# Patient Record
Sex: Female | Born: 2000 | Race: White | Hispanic: No | Marital: Single | Smoking: Never smoker
Health system: Southern US, Community
[De-identification: ages and names within clinical notes are randomized; demographics above are authoritative.]

## PROBLEM LIST (undated history)

## (undated) DIAGNOSIS — D6800 Von Willebrand disease, unspecified: Secondary | ICD-10-CM

## (undated) DIAGNOSIS — D699 Hemorrhagic condition, unspecified: Secondary | ICD-10-CM

---

## 2020-04-05 ENCOUNTER — Encounter (HOSPITAL_COMMUNITY): Payer: Self-pay

## 2020-04-05 ENCOUNTER — Emergency Department (HOSPITAL_COMMUNITY): Payer: Medicaid - Out of State

## 2020-04-05 ENCOUNTER — Other Ambulatory Visit: Payer: Self-pay

## 2020-04-05 ENCOUNTER — Emergency Department (HOSPITAL_COMMUNITY)
Admission: EM | Admit: 2020-04-05 | Discharge: 2020-04-05 | Disposition: A | Discharge status: Home / Self Care | Payer: Medicaid - Out of State | Attending: Emergency Medicine | Admitting: Emergency Medicine

## 2020-04-05 DIAGNOSIS — Z20822 Contact with and (suspected) exposure to covid-19: Secondary | ICD-10-CM | POA: Insufficient documentation

## 2020-04-05 DIAGNOSIS — R110 Nausea: Secondary | ICD-10-CM | POA: Insufficient documentation

## 2020-04-05 DIAGNOSIS — R1031 Right lower quadrant pain: Secondary | ICD-10-CM | POA: Diagnosis present

## 2020-04-05 HISTORY — DX: Hemorrhagic condition, unspecified: D69.9

## 2020-04-05 LAB — CBC WITH DIFFERENTIAL/PLATELET
Abs Immature Granulocytes: 0.02 10*3/uL (ref 0.00–0.07)
Basophils Absolute: 0 10*3/uL (ref 0.0–0.1)
Basophils Relative: 0 %
Eosinophils Absolute: 0.1 10*3/uL (ref 0.0–0.5)
Eosinophils Relative: 1 %
HCT: 39 % (ref 36.0–46.0)
Hemoglobin: 13.3 g/dL (ref 12.0–15.0)
Immature Granulocytes: 0 %
Lymphocytes Relative: 19 %
Lymphs Abs: 2.1 10*3/uL (ref 0.7–4.0)
MCH: 30 pg (ref 26.0–34.0)
MCHC: 34.1 g/dL (ref 30.0–36.0)
MCV: 87.8 fL (ref 80.0–100.0)
Monocytes Absolute: 0.5 10*3/uL (ref 0.1–1.0)
Monocytes Relative: 5 %
Neutro Abs: 8.3 10*3/uL — ABNORMAL HIGH (ref 1.7–7.7)
Neutrophils Relative %: 75 %
Platelets: 350 10*3/uL (ref 150–400)
RBC: 4.44 MIL/uL (ref 3.87–5.11)
RDW: 13.1 % (ref 11.5–15.5)
WBC: 11.1 10*3/uL — ABNORMAL HIGH (ref 4.0–10.5)
nRBC: 0 % (ref 0.0–0.2)

## 2020-04-05 LAB — URINALYSIS, ROUTINE W REFLEX MICROSCOPIC
Bilirubin Urine: NEGATIVE
Glucose, UA: NEGATIVE mg/dL
Hgb urine dipstick: NEGATIVE
Ketones, ur: 5 mg/dL — AB
Leukocytes,Ua: NEGATIVE
Nitrite: NEGATIVE
Protein, ur: NEGATIVE mg/dL
Specific Gravity, Urine: 1.021 (ref 1.005–1.030)
pH: 6 (ref 5.0–8.0)

## 2020-04-05 LAB — BASIC METABOLIC PANEL
Anion gap: 10 (ref 5–15)
BUN: 12 mg/dL (ref 6–20)
CO2: 25 mmol/L (ref 22–32)
Calcium: 9.5 mg/dL (ref 8.9–10.3)
Chloride: 103 mmol/L (ref 98–111)
Creatinine, Ser: 0.61 mg/dL (ref 0.44–1.00)
GFR calc non Af Amer: >60 mL/min (ref >60)
Glucose, Bld: 99 mg/dL (ref 70–99)
Potassium: 3.8 mmol/L (ref 3.5–5.1)
Sodium: 138 mmol/L (ref 135–145)

## 2020-04-05 LAB — I-STAT BETA HCG BLOOD, ED (MC, WL, AP ONLY): I-stat hCG, quantitative: <5 m[IU]/mL (ref <5)

## 2020-04-05 LAB — RESPIRATORY PANEL BY RT PCR (FLU A&B, COVID)
Influenza A by PCR: NEGATIVE
Influenza B by PCR: NEGATIVE
SARS Coronavirus 2 by RT PCR: NEGATIVE

## 2020-04-05 MED ORDER — IOHEXOL 300 MG/ML  SOLN
100.0000 mL | Freq: Once | INTRAMUSCULAR | Status: AC | PRN
  Administered 2020-04-05: 100 mL via INTRAVENOUS

## 2020-04-05 MED ORDER — ONDANSETRON HCL 4 MG/2ML IJ SOLN
4.0000 mg | Freq: Once | INTRAMUSCULAR | Status: AC
  Administered 2020-04-05: 4 mg via INTRAVENOUS
  Filled 2020-04-05: qty 2

## 2020-04-05 MED ORDER — DOCUSATE SODIUM 100 MG PO CAPS: 100.0000 mg | ORAL_CAPSULE | Freq: Two times a day (BID) | ORAL | 0 refills | Status: AC

## 2020-04-05 MED ORDER — MORPHINE SULFATE (PF) 4 MG/ML IV SOLN
4.0000 mg | Freq: Once | INTRAVENOUS | Status: AC
  Administered 2020-04-05: 4 mg via INTRAVENOUS
  Filled 2020-04-05: qty 1

## 2020-04-05 MED ORDER — SODIUM CHLORIDE (PF) 0.9 % IJ SOLN
INTRAMUSCULAR | Status: AC
  Filled 2020-04-05: qty 50

## 2020-04-05 NOTE — ED Provider Notes (Signed)
[facility]-EMERGENCY DEPT Provider Note   CSN: [id number] Arrival date & time: 04/05/20  0019     History No chief complaint on file.   Tammy Oconnor is a 19 y.o. female.  Patient with past medical history notable for prior ectopic pregnancy presents to the emergency department with a chief complaint of right lower quadrant pain. She states that the pain feels similar to previous ectopic pregnancy. She reports associated nausea. States that the pain started suddenly this evening at 11 PM. She rates her pain is severe. Denies any treatments prior to arrival. Denies prior abdominal surgeries. Denies vaginal discharge, vaginal bleeding, dysuria, or hematuria. Denies any other associated symptoms.  The history is provided by the patient. No language interpreter was used.       Past Medical History:  Diagnosis Date  . Bleeding disorder (HCC)     There are no problems to display for this patient.   History reviewed. No pertinent surgical history.   OB History   No obstetric history on file.     History reviewed. No pertinent family history.  Social History   Tobacco Use  . Smoking status: Never Smoker  . Smokeless tobacco: Never Used  Substance Use Topics  . Alcohol use: Never  . Drug use: Never    Home Medications Prior to Admission medications   Not on File    Allergies    Patient has no allergy information on record.  Review of Systems   Review of Systems  All other systems reviewed and are negative.   Physical Exam Updated Vital Signs BP (!) 150/81 (BP Location: Left Arm)   Pulse 99   Temp 98.4 F (36.9 C) (Oral)   Resp 17   Ht 5\' 2"  (1.575 m)   Wt 59 kg   LMP 03/28/2020   SpO2 94%   BMI 23.78 kg/m   Physical Exam Vitals and nursing note reviewed.  Constitutional:      General: She is not in acute distress.    Appearance: She is well-developed.     Comments: Appears uncomfortable  HENT:     Head: Normocephalic and  atraumatic.  Eyes:     Conjunctiva/sclera: Conjunctivae normal.  Cardiovascular:     Rate and Rhythm: Normal rate and regular rhythm.     Heart sounds: No murmur heard.   Pulmonary:     Effort: Pulmonary effort is normal. No respiratory distress.     Breath sounds: Normal breath sounds.  Abdominal:     Palpations: Abdomen is soft.     Tenderness: There is abdominal tenderness.     Comments: Right lower quadrant tenderness  Musculoskeletal:     Cervical back: Neck supple.  Skin:    General: Skin is warm and dry.  Neurological:     Mental Status: She is alert and oriented to person, place, and time.  Psychiatric:        Mood and Affect: Mood normal.        Behavior: Behavior normal.     ED Results / Procedures / Treatments   Labs (all labs ordered are listed, but only abnormal results are displayed) Labs Reviewed  RESPIRATORY PANEL BY RT PCR (FLU A&B, COVID)  BASIC METABOLIC PANEL  CBC WITH DIFFERENTIAL/PLATELET  URINALYSIS, ROUTINE W REFLEX MICROSCOPIC  I-STAT BETA HCG BLOOD, ED ([facility], [facility], [facility] ONLY)    EKG None  Radiology No results found.  Procedures Procedures (including critical care time)  Medications Ordered in ED Medications  morphine 4 MG/ML injection 4 mg (has no administration in time range)  ondansetron (ZOFRAN) injection 4 mg (has no administration in time range)    ED Course  I have reviewed the triage vital signs and the nursing notes.  Pertinent labs & imaging results that were available during my care of the patient were reviewed by me and considered in my medical decision making (see chart for details).    MDM Rules/Calculators/A&P                          This patient complains of RLQ pain, this involves an extensive number of treatment options, and is a complaint that carries with it a high risk of complications and morbidity.    Differential Dx Ectopic, TOA, torsion, appy, KS, UTI  Pertinent Labs I ordered, reviewed, and interpreted  labs, which included CBC, BMP, HCG, UA notable for negative pregnancy test and mild leukocytosis of 11.1, otherwise labs are unremarkable.  Imaging Interpretation I ordered imaging studies which included [place] of pelvis to r/o torsion and CT abd/pel to r/o appy, which showed no acute findings.  Moderate stool burden.  No torsion.  No evidence of appy or KS.Marland Kitchen   Medications I ordered medication morphine for pain.  Reassessments After the interventions stated above, I reevaluated the patient and found improved, but still having some pain  Consultants none  Plan Based on reassuring results and vitals, will trial colace for stool burden.  DC to home with return precautions.      Final Clinical Impression(s) / ED Diagnoses Final diagnoses:  Right lower quadrant abdominal pain    Rx / DC Orders ED Discharge Orders         Ordered    docusate sodium (COLACE) 100 MG capsule  Every 12 hours        04/05/20 0343           Roxy Horseman, PA-C 04/05/20 0346    Horton, Mayer Masker, MD 04/05/20 [phone]

## 2020-04-05 NOTE — ED Triage Notes (Signed)
Pt complains of a sudden onset of lower abd pain, denies vomiting or diarrhea but states she became really nauseated

## 2020-06-11 ENCOUNTER — Ambulatory Visit (INDEPENDENT_AMBULATORY_CARE_PROVIDER_SITE_OTHER): Payer: Medicaid Other

## 2020-06-11 ENCOUNTER — Ambulatory Visit (HOSPITAL_COMMUNITY)
Admission: EM | Admit: 2020-06-11 | Discharge: 2020-06-11 | Disposition: A | Discharge status: Home / Self Care | Payer: Medicaid Other | Attending: Family Medicine | Admitting: Family Medicine

## 2020-06-11 ENCOUNTER — Other Ambulatory Visit: Payer: Self-pay

## 2020-06-11 DIAGNOSIS — R1011 Right upper quadrant pain: Secondary | ICD-10-CM | POA: Insufficient documentation

## 2020-06-11 DIAGNOSIS — R1110 Vomiting, unspecified: Secondary | ICD-10-CM

## 2020-06-11 DIAGNOSIS — R10811 Right upper quadrant abdominal tenderness: Secondary | ICD-10-CM

## 2020-06-11 LAB — COMPREHENSIVE METABOLIC PANEL
ALT: 13 U/L (ref 0–44)
AST: 15 U/L (ref 15–41)
Albumin: 4.4 g/dL (ref 3.5–5.0)
Alkaline Phosphatase: 55 U/L (ref 38–126)
Anion gap: 10 (ref 5–15)
BUN: 9 mg/dL (ref 6–20)
CO2: 23 mmol/L (ref 22–32)
Calcium: 9.1 mg/dL (ref 8.9–10.3)
Chloride: 104 mmol/L (ref 98–111)
Creatinine, Ser: 0.62 mg/dL (ref 0.44–1.00)
GFR, Estimated: >60 mL/min (ref >60)
Glucose, Bld: 93 mg/dL (ref 70–99)
Potassium: 3.7 mmol/L (ref 3.5–5.1)
Sodium: 137 mmol/L (ref 135–145)
Total Bilirubin: 0.8 mg/dL (ref 0.3–1.2)
Total Protein: 7.8 g/dL (ref 6.5–8.1)

## 2020-06-11 LAB — POCT URINALYSIS DIPSTICK, ED / UC
Bilirubin Urine: NEGATIVE
Glucose, UA: NEGATIVE mg/dL
Ketones, ur: 15 mg/dL — AB
Leukocytes,Ua: NEGATIVE
Nitrite: NEGATIVE
Protein, ur: NEGATIVE mg/dL
Specific Gravity, Urine: >=1.03 (ref 1.005–1.030)
Urobilinogen, UA: 0.2 mg/dL (ref 0.0–1.0)
pH: 5.5 (ref 5.0–8.0)

## 2020-06-11 LAB — CBC WITH DIFFERENTIAL/PLATELET
Abs Immature Granulocytes: 0.07 10*3/uL (ref 0.00–0.07)
Basophils Absolute: 0 10*3/uL (ref 0.0–0.1)
Basophils Relative: 0 %
Eosinophils Absolute: 0.1 10*3/uL (ref 0.0–0.5)
Eosinophils Relative: 1 %
HCT: 40.9 % (ref 36.0–46.0)
Hemoglobin: 13.6 g/dL (ref 12.0–15.0)
Immature Granulocytes: 1 %
Lymphocytes Relative: 12 %
Lymphs Abs: 1.8 10*3/uL (ref 0.7–4.0)
MCH: 29.6 pg (ref 26.0–34.0)
MCHC: 33.3 g/dL (ref 30.0–36.0)
MCV: 89.1 fL (ref 80.0–100.0)
Monocytes Absolute: 0.7 10*3/uL (ref 0.1–1.0)
Monocytes Relative: 5 %
Neutro Abs: 12.2 10*3/uL — ABNORMAL HIGH (ref 1.7–7.7)
Neutrophils Relative %: 81 %
Platelets: 313 10*3/uL (ref 150–400)
RBC: 4.59 MIL/uL (ref 3.87–5.11)
RDW: 12.1 % (ref 11.5–15.5)
WBC: 14.9 10*3/uL — ABNORMAL HIGH (ref 4.0–10.5)
nRBC: 0 % (ref 0.0–0.2)

## 2020-06-11 LAB — AMYLASE: Amylase: 67 U/L (ref 28–100)

## 2020-06-11 LAB — LIPASE, BLOOD: Lipase: 32 U/L (ref 11–51)

## 2020-06-11 NOTE — ED Triage Notes (Signed)
Pt present RUQ abdominal pain with vomiting. Pt states symptoms started two weeks ago. Pt states that when she eats the pain becomes unbearable an she cannot hold any food down.

## 2020-06-11 NOTE — Discharge Instructions (Addendum)
X-ray showed moderate amount of stool in the right side of your abdomen.  You can try doing MiraLAX daily for this. Your white blood cell count was mildly elevated.  This may indicate some sort of underlying infection versus some sort of viral illness.  Your other lab work was normal. Parke Simmers diet and drink fluids for now to stay hydrated.  If your symptoms worsen to include worsening abdominal pain, nausea vomiting or diarrhea you will need to go to the ER.

## 2020-06-11 NOTE — ED Provider Notes (Signed)
[facility]    CSN: [id number] Arrival date & time: 06/11/20  1406      History   Chief Complaint Chief Complaint  Patient presents with  . Abdominal Pain    RUQ    HPI Tammy Oconnor is a 19 y.o. female.   Patient is a 19 year old female presents today with approximately 2 weeks of right upper quadrant abdominal pain.  This has been pretty constant.  Reporting vomiting after eating meals.  Denies any specific diet of spicy, greasy or fried.  She has not had any diarrhea or constipation.  No fevers.  The pain waxes and wanes.  No blood in stool.  Denies any alcohol or drug use     Past Medical History:  Diagnosis Date  . Bleeding disorder (HCC)     There are no problems to display for this patient.   No past surgical history on file.  OB History   No obstetric history on file.      Home Medications    Prior to Admission medications   Medication Sig Start Date End Date Taking? Authorizing Provider  docusate sodium (COLACE) 100 MG capsule Take 1 capsule (100 mg total) by mouth every 12 (twelve) hours. 04/05/20   Roxy Horseman, PA-C    Family History No family history on file.  Social History Social History   Tobacco Use  . Smoking status: Never Smoker  . Smokeless tobacco: Never Used  Substance Use Topics  . Alcohol use: Never  . Drug use: Never     Allergies   Patient has no known allergies.   Review of Systems Review of Systems   Physical Exam Triage Vital Signs ED Triage Vitals  Enc Vitals Group     BP 06/11/20 1443 (!) 131/55     Pulse Rate 06/11/20 1443 86     Resp 06/11/20 1443 16     Temp 06/11/20 1443 97.8 F (36.6 C)     Temp Source 06/11/20 1443 Oral     SpO2 06/11/20 1443 100 %     Weight --      Height --      Head Circumference --      Peak Flow --      Pain Score 06/11/20 1441 4     Pain Loc --      Pain Edu? --      Excl. in GC? --    No data found.  Updated Vital Signs BP (!) 131/55 (BP Location:  Right Arm)   Pulse 86   Temp 97.8 F (36.6 C) (Oral)   Resp 16   SpO2 100%   Visual Acuity Right Eye Distance:   Left Eye Distance:   Bilateral Distance:    Right Eye Near:   Left Eye Near:    Bilateral Near:     Physical Exam Vitals and nursing note reviewed.  Constitutional:      General: She is not in acute distress.    Appearance: Normal appearance. She is not ill-appearing, toxic-appearing or diaphoretic.  HENT:     Head: Normocephalic.     Nose: Nose normal.     Mouth/Throat:     Pharynx: Oropharynx is clear.  Eyes:     Conjunctiva/sclera: Conjunctivae normal.  Pulmonary:     Effort: Pulmonary effort is normal.  Abdominal:     General: Abdomen is flat. Bowel sounds are increased.     Palpations: Abdomen is soft. There is no hepatomegaly or splenomegaly.  Tenderness: There is abdominal tenderness in the right upper quadrant. There is no right CVA tenderness, left CVA tenderness, guarding or rebound. Negative signs include Murphy's sign.  Musculoskeletal:        General: Normal range of motion.     Cervical back: Normal range of motion.  Skin:    General: Skin is warm and dry.     Findings: No rash.  Neurological:     Mental Status: She is alert.  Psychiatric:        Mood and Affect: Mood normal.      UC Treatments / Results  Labs (all labs ordered are listed, but only abnormal results are displayed) Labs Reviewed  CBC WITH DIFFERENTIAL/PLATELET - Abnormal; Notable for the following components:      Result Value   WBC 14.9 (*)    Neutro Abs 12.2 (*)    All other components within normal limits  POCT URINALYSIS DIPSTICK, ED / UC - Abnormal; Notable for the following components:   Ketones, ur 15 (*)    Hgb urine dipstick TRACE (*)    All other components within normal limits  COMPREHENSIVE METABOLIC PANEL  LIPASE, BLOOD  AMYLASE    EKG   Radiology DG Abd 1 View  Result Date: 06/11/2020 CLINICAL DATA:  Abdominal pain right upper quadrant.  Vomiting after eating for 2 weeks. EXAM: ABDOMEN - 1 VIEW COMPARISON:  Abdominal CT 04/05/2020 FINDINGS: No bowel dilatation to suggest obstruction. No evidence of free air in the supine views. Moderate stool in the right colon, small volume of stool distally. No abnormal rectal distention. No radiopaque calculi or abnormal soft tissue calcifications. No osseous abnormalities are seen. IMPRESSION: Negative abdominal radiograph. Electronically Signed   By: Narda Rutherford M.D.   On: 06/11/2020 15:55    Procedures Procedures (including critical care time)  Medications Ordered in UC Medications - No data to display  Initial Impression / Assessment and Plan / UC Course  I have reviewed the triage vital signs and the nursing notes.  Pertinent labs & imaging results that were available during my care of the patient were reviewed by me and considered in my medical decision making (see chart for details).     Right upper quadrant abdominal pain Differentials include cholecystitis versus constipation versus viral etiology X-ray with moderate amount of stool specifically in the right colon.  Recommended MiraLAX for this. Blood work mostly unremarkable besides mildly elevated white blood cell count 14.9 Recommended bland diet for now and stay hydrated with fluids. Monitor, rest and for any worsening symptoms she will need to go to the ER for further management Pt understanding and agreed.  Final Clinical Impressions(s) / UC Diagnoses   Final diagnoses:  RUQ pain     Discharge Instructions     X-ray showed moderate amount of stool in the right side of your abdomen.  You can try doing MiraLAX daily for this. Your white blood cell count was mildly elevated.  This may indicate some sort of underlying infection versus some sort of viral illness.  Your other lab work was normal. Parke Simmers diet and drink fluids for now to stay hydrated.  If your symptoms worsen to include worsening abdominal pain,  nausea vomiting or diarrhea you will need to go to the ER.    ED Prescriptions    None     PDMP not reviewed this encounter.   Janace Aris, NP 06/11/20 1715

## 2020-06-16 ENCOUNTER — Emergency Department (HOSPITAL_COMMUNITY)
Admission: EM | Admit: 2020-06-16 | Discharge: 2020-06-17 | Disposition: A | Discharge status: Home / Self Care | Payer: Medicaid Other | Attending: Emergency Medicine | Admitting: Emergency Medicine

## 2020-06-16 ENCOUNTER — Emergency Department (HOSPITAL_COMMUNITY): Payer: Medicaid Other

## 2020-06-16 ENCOUNTER — Other Ambulatory Visit: Payer: Self-pay

## 2020-06-16 ENCOUNTER — Encounter (HOSPITAL_COMMUNITY): Payer: Self-pay

## 2020-06-16 DIAGNOSIS — R1011 Right upper quadrant pain: Secondary | ICD-10-CM | POA: Diagnosis not present

## 2020-06-16 LAB — COMPREHENSIVE METABOLIC PANEL
ALT: 11 U/L (ref 0–44)
AST: 14 U/L — ABNORMAL LOW (ref 15–41)
Albumin: 4.5 g/dL (ref 3.5–5.0)
Alkaline Phosphatase: 52 U/L (ref 38–126)
Anion gap: 11 (ref 5–15)
BUN: 11 mg/dL (ref 6–20)
CO2: 24 mmol/L (ref 22–32)
Calcium: 9 mg/dL (ref 8.9–10.3)
Chloride: 102 mmol/L (ref 98–111)
Creatinine, Ser: 0.58 mg/dL (ref 0.44–1.00)
GFR, Estimated: >60 mL/min (ref >60)
Glucose, Bld: 85 mg/dL (ref 70–99)
Potassium: 3.5 mmol/L (ref 3.5–5.1)
Sodium: 137 mmol/L (ref 135–145)
Total Bilirubin: 0.7 mg/dL (ref 0.3–1.2)
Total Protein: 7.6 g/dL (ref 6.5–8.1)

## 2020-06-16 LAB — URINALYSIS, ROUTINE W REFLEX MICROSCOPIC
Bacteria, UA: NONE SEEN
Bilirubin Urine: NEGATIVE
Glucose, UA: NEGATIVE mg/dL
Ketones, ur: 80 mg/dL — AB
Leukocytes,Ua: NEGATIVE
Nitrite: NEGATIVE
Protein, ur: NEGATIVE mg/dL
RBC / HPF: >50 RBC/hpf — ABNORMAL HIGH (ref 0–5)
Specific Gravity, Urine: 1.027 (ref 1.005–1.030)
pH: 5 (ref 5.0–8.0)

## 2020-06-16 LAB — CBC
HCT: 37.2 % (ref 36.0–46.0)
Hemoglobin: 12.5 g/dL (ref 12.0–15.0)
MCH: 29.9 pg (ref 26.0–34.0)
MCHC: 33.6 g/dL (ref 30.0–36.0)
MCV: 89 fL (ref 80.0–100.0)
Platelets: 316 10*3/uL (ref 150–400)
RBC: 4.18 MIL/uL (ref 3.87–5.11)
RDW: 12.1 % (ref 11.5–15.5)
WBC: 9.1 10*3/uL (ref 4.0–10.5)
nRBC: 0 % (ref 0.0–0.2)

## 2020-06-16 LAB — I-STAT BETA HCG BLOOD, ED (MC, WL, AP ONLY): I-stat hCG, quantitative: <5 m[IU]/mL (ref <5)

## 2020-06-16 LAB — LIPASE, BLOOD: Lipase: 29 U/L (ref 11–51)

## 2020-06-16 MED ORDER — SODIUM CHLORIDE 0.9 % IV BOLUS
1000.0000 mL | Freq: Once | INTRAVENOUS | Status: AC
  Administered 2020-06-16: 1000 mL via INTRAVENOUS

## 2020-06-16 MED ORDER — MORPHINE SULFATE (PF) 4 MG/ML IV SOLN
4.0000 mg | Freq: Once | INTRAVENOUS | Status: AC
  Administered 2020-06-16: 4 mg via INTRAVENOUS
  Filled 2020-06-16: qty 1

## 2020-06-16 MED ORDER — ONDANSETRON 4 MG PO TBDP
4.0000 mg | ORAL_TABLET | Freq: Once | ORAL | Status: AC
  Administered 2020-06-16: 4 mg via ORAL
  Filled 2020-06-16: qty 1

## 2020-06-16 MED ORDER — ONDANSETRON HCL 4 MG/2ML IJ SOLN
4.0000 mg | Freq: Once | INTRAMUSCULAR | Status: AC
  Administered 2020-06-16: 4 mg via INTRAVENOUS
  Filled 2020-06-16: qty 2

## 2020-06-16 NOTE — ED Notes (Signed)
Pt c/o right upper quadrant pain. Pt states every time see eats she vomits

## 2020-06-16 NOTE — ED Provider Notes (Signed)
[facility]-EMERGENCY DEPT Provider Note   CSN: [id number] Arrival date & time: 06/16/20  1846     History No chief complaint on file.   Tammy Oconnor is a 19 y.o. female.  Patient presents to the ED with a chief complaint of RUQ abdominal pain.  She states that she has been having the symptoms for the past 3 weeks, but they are becoming increasingly frequent.  She associated worsened pain with eating food.  She has had associated nausea and vomiting.  Denies any changes in BM.  Denies any dysuria.  Denies any successful treatment PTA.  The history is provided by the patient. No language interpreter was used.       Past Medical History:  Diagnosis Date  . Bleeding disorder (HCC)     There are no problems to display for this patient.   History reviewed. No pertinent surgical history.   OB History   No obstetric history on file.     Family History  Problem Relation Age of Onset  . Migraines Mother   . Depression Mother   . Migraines Father     Social History   Tobacco Use  . Smoking status: Never Smoker  . Smokeless tobacco: Never Used  Vaping Use  . Vaping Use: Never used  Substance Use Topics  . Alcohol use: Never  . Drug use: Never    Home Medications Prior to Admission medications   Medication Sig Start Date End Date Taking? Authorizing Provider  docusate sodium (COLACE) 100 MG capsule Take 1 capsule (100 mg total) by mouth every 12 (twelve) hours. 04/05/20   Roxy Horseman, PA-C    Allergies    Dhea [nutritional supplements]  Review of Systems   Review of Systems  All other systems reviewed and are negative.   Physical Exam Updated Vital Signs BP 129/78 (BP Location: Left Arm)   Pulse 80   Temp 98.3 F (36.8 C) (Oral)   Resp 16   Ht 5\' 2"  (1.575 m)   Wt 63.5 kg   LMP 06/16/2020   SpO2 100%   BMI 25.61 kg/m   Physical Exam Vitals and nursing note reviewed.  Constitutional:      General: She is not in acute  distress.    Appearance: She is well-developed and well-nourished.  HENT:     Head: Normocephalic and atraumatic.  Eyes:     Conjunctiva/sclera: Conjunctivae normal.  Cardiovascular:     Rate and Rhythm: Normal rate and regular rhythm.     Heart sounds: No murmur heard.   Pulmonary:     Effort: Pulmonary effort is normal. No respiratory distress.     Breath sounds: Normal breath sounds.  Abdominal:     Palpations: Abdomen is soft.     Tenderness: There is abdominal tenderness.     Comments: RUQ tenderness  Musculoskeletal:        General: No edema. Normal range of motion.     Cervical back: Neck supple.  Skin:    General: Skin is warm and dry.  Neurological:     Mental Status: She is alert and oriented to person, place, and time.  Psychiatric:        Mood and Affect: Mood and affect and mood normal.        Behavior: Behavior normal.     ED Results / Procedures / Treatments   Labs (all labs ordered are listed, but only abnormal results are displayed) Labs Reviewed  COMPREHENSIVE METABOLIC PANEL -  Abnormal; Notable for the following components:      Result Value   AST 14 (*)    All other components within normal limits  URINALYSIS, ROUTINE W REFLEX MICROSCOPIC - Abnormal; Notable for the following components:   Hgb urine dipstick SMALL (*)    Ketones, ur 80 (*)    RBC / HPF >50 (*)    All other components within normal limits  LIPASE, BLOOD  CBC  I-STAT BETA HCG BLOOD, ED ([facility], [facility], [facility] ONLY)    EKG None  Radiology No results found.  Procedures Procedures (including critical care time)  Medications Ordered in ED Medications  morphine 4 MG/ML injection 4 mg (has no administration in time range)  ondansetron (ZOFRAN) injection 4 mg (has no administration in time range)  sodium chloride 0.9 % bolus 1,000 mL (has no administration in time range)  ondansetron (ZOFRAN-ODT) disintegrating tablet 4 mg (4 mg Oral Given 06/16/20 2044)    ED Course  I have reviewed  the triage vital signs and the nursing notes.  Pertinent labs & imaging results that were available during my care of the patient were reviewed by me and considered in my medical decision making (see chart for details).    MDM Rules/Calculators/A&P                          This patient complains of right upper quadrant abdominal pain, this involves an extensive number of treatment options, and is a complaint that carries with it a high risk of complications and morbidity.    Differential Dx Cholecystitis, cholelithiasis, kidney stone, pneumonia  Pertinent Labs I ordered, reviewed, and interpreted labs, show normal CBC, CMP, lipase.  UA has some blood, but I think KS is less likely given presentation.  Imaging Interpretation I ordered imaging studies which included RUQ [place], which showed no stone or wall thickening, normal CBD.   Medications I ordered medication morphine for pain.  Reassessments After the interventions stated above, I reevaluated the patient and found feeling improved.  Stable for discharge.  Plan DC to home with PCP follow-up.  Will trial omeprazole and carafate.  Seen by and discussed with Dr. Eudelia Bunch, who agrees with plan.     Final Clinical Impression(s) / ED Diagnoses Final diagnoses:  RUQ abdominal pain    Rx / DC Orders ED Discharge Orders         Ordered    omeprazole (PRILOSEC) 20 MG capsule  Daily        06/17/20 0058    sucralfate (CARAFATE) 1 g tablet  3 times daily with meals & bedtime        06/17/20 0058           Roxy Horseman, PA-C 06/17/20 0104    Nira Conn, MD 06/19/20 1136

## 2020-06-16 NOTE — ED Triage Notes (Signed)
Patient c/o RUQ pain and vomiting x 2 weeks.f Patient states the pain worsens after eating.

## 2020-06-17 MED ORDER — SUCRALFATE 1 G PO TABS: 1.0000 g | ORAL_TABLET | Freq: Three times a day (TID) | ORAL | 0 refills | Status: AC

## 2020-06-17 MED ORDER — OMEPRAZOLE 20 MG PO CPDR: 20.0000 mg | DELAYED_RELEASE_CAPSULE | Freq: Every day | ORAL | 0 refills | Status: AC

## 2020-06-17 NOTE — Discharge Instructions (Addendum)
No clear cause of your symptoms was found tonight.  Your ultrasound was reassuring.  I am going to trial  you on medication that can help with stomach ulcer.  Take the medication for 2 weeks.  If improving, continue it for another 2 weeks.  If not improving, see your doctor.  If you get worse, return to the ER.

## 2020-06-23 ENCOUNTER — Emergency Department (HOSPITAL_COMMUNITY): Payer: Medicaid Other

## 2020-06-23 ENCOUNTER — Emergency Department (HOSPITAL_COMMUNITY)
Admission: EM | Admit: 2020-06-23 | Discharge: 2020-06-23 | Disposition: A | Discharge status: Home / Self Care | Payer: Medicaid Other | Attending: Emergency Medicine | Admitting: Emergency Medicine

## 2020-06-23 ENCOUNTER — Encounter (HOSPITAL_COMMUNITY): Payer: Self-pay | Admitting: Obstetrics and Gynecology

## 2020-06-23 ENCOUNTER — Other Ambulatory Visit: Payer: Self-pay

## 2020-06-23 DIAGNOSIS — R1013 Epigastric pain: Secondary | ICD-10-CM | POA: Insufficient documentation

## 2020-06-23 DIAGNOSIS — R112 Nausea with vomiting, unspecified: Secondary | ICD-10-CM | POA: Insufficient documentation

## 2020-06-23 DIAGNOSIS — R1011 Right upper quadrant pain: Secondary | ICD-10-CM | POA: Insufficient documentation

## 2020-06-23 LAB — URINALYSIS, ROUTINE W REFLEX MICROSCOPIC
Bilirubin Urine: NEGATIVE
Glucose, UA: NEGATIVE mg/dL
Hgb urine dipstick: NEGATIVE
Ketones, ur: NEGATIVE mg/dL
Leukocytes,Ua: NEGATIVE
Nitrite: NEGATIVE
Protein, ur: NEGATIVE mg/dL
Specific Gravity, Urine: 1.021 (ref 1.005–1.030)
pH: 7 (ref 5.0–8.0)

## 2020-06-23 LAB — COMPREHENSIVE METABOLIC PANEL
ALT: 12 U/L (ref 0–44)
AST: 14 U/L — ABNORMAL LOW (ref 15–41)
Albumin: 4.6 g/dL (ref 3.5–5.0)
Alkaline Phosphatase: 55 U/L (ref 38–126)
Anion gap: 9 (ref 5–15)
BUN: 10 mg/dL (ref 6–20)
CO2: 26 mmol/L (ref 22–32)
Calcium: 9.2 mg/dL (ref 8.9–10.3)
Chloride: 104 mmol/L (ref 98–111)
Creatinine, Ser: 0.66 mg/dL (ref 0.44–1.00)
GFR, Estimated: >60 mL/min (ref >60)
Glucose, Bld: 97 mg/dL (ref 70–99)
Potassium: 4 mmol/L (ref 3.5–5.1)
Sodium: 139 mmol/L (ref 135–145)
Total Bilirubin: 0.7 mg/dL (ref 0.3–1.2)
Total Protein: 7.8 g/dL (ref 6.5–8.1)

## 2020-06-23 LAB — CBC
HCT: 40.4 % (ref 36.0–46.0)
Hemoglobin: 13.3 g/dL (ref 12.0–15.0)
MCH: 29.8 pg (ref 26.0–34.0)
MCHC: 32.9 g/dL (ref 30.0–36.0)
MCV: 90.4 fL (ref 80.0–100.0)
Platelets: 309 10*3/uL (ref 150–400)
RBC: 4.47 MIL/uL (ref 3.87–5.11)
RDW: 12.2 % (ref 11.5–15.5)
WBC: 6.3 10*3/uL (ref 4.0–10.5)
nRBC: 0 % (ref 0.0–0.2)

## 2020-06-23 LAB — RAPID URINE DRUG SCREEN, HOSP PERFORMED
Amphetamines: NOT DETECTED
Barbiturates: NOT DETECTED
Benzodiazepines: NOT DETECTED
Cocaine: NOT DETECTED
Opiates: NOT DETECTED
Tetrahydrocannabinol: POSITIVE — AB

## 2020-06-23 LAB — I-STAT BETA HCG BLOOD, ED (MC, WL, AP ONLY): I-stat hCG, quantitative: <5 m[IU]/mL (ref <5)

## 2020-06-23 LAB — LIPASE, BLOOD: Lipase: 38 U/L (ref 11–51)

## 2020-06-23 MED ORDER — KETOROLAC TROMETHAMINE 30 MG/ML IJ SOLN
30.0000 mg | Freq: Once | INTRAMUSCULAR | Status: AC
  Administered 2020-06-23: 30 mg via INTRAVENOUS
  Filled 2020-06-23: qty 1

## 2020-06-23 MED ORDER — IOHEXOL 300 MG/ML  SOLN
100.0000 mL | Freq: Once | INTRAMUSCULAR | Status: AC | PRN
  Administered 2020-06-23: 100 mL via INTRAVENOUS

## 2020-06-23 MED ORDER — SODIUM CHLORIDE 0.9 % IV BOLUS
1000.0000 mL | Freq: Once | INTRAVENOUS | Status: AC
Start: 2020-06-23 — End: 2020-06-23
  Administered 2020-06-23: 1000 mL via INTRAVENOUS

## 2020-06-23 MED ORDER — HYDROCODONE-ACETAMINOPHEN 5-325 MG PO TABS: 1.0000 | ORAL_TABLET | ORAL | 0 refills | Status: AC | PRN

## 2020-06-23 MED ORDER — ONDANSETRON HCL 4 MG/2ML IJ SOLN
4.0000 mg | Freq: Once | INTRAMUSCULAR | Status: AC
Start: 2020-06-23 — End: 2020-06-23
  Administered 2020-06-23: 4 mg via INTRAVENOUS
  Filled 2020-06-23: qty 2

## 2020-06-23 MED ORDER — ONDANSETRON 4 MG PO TBDP: 4.0000 mg | ORAL_TABLET | Freq: Three times a day (TID) | ORAL | 0 refills | Status: DC | PRN

## 2020-06-23 MED ORDER — HYDROMORPHONE HCL 1 MG/ML IJ SOLN
1.0000 mg | Freq: Once | INTRAMUSCULAR | Status: AC
  Administered 2020-06-23: 1 mg via INTRAVENOUS
  Filled 2020-06-23: qty 1

## 2020-06-23 MED ORDER — MORPHINE SULFATE (PF) 4 MG/ML IV SOLN
4.0000 mg | Freq: Once | INTRAVENOUS | Status: AC
Start: 2020-06-23 — End: 2020-06-23
  Administered 2020-06-23: 4 mg via INTRAVENOUS
  Filled 2020-06-23: qty 1

## 2020-06-23 NOTE — ED Provider Notes (Signed)
[facility]-EMERGENCY DEPT Provider Note   CSN: [id number] Arrival date & time: 06/23/20  1129     History Chief Complaint  Patient presents with  . Abdominal Pain  . Emesis    Tammy Oconnor is Oconnor 19 y.o. female with past medical history who presents for evaluation of abdominal pain.  Patient states she has had abdominal pain over the last few months however worse over the last few weeks.  Located to her epigastric and right upper quadrant.  Pain is constant.  No associated diarrhea.  Has had multiple episodes of emesis.  Has been seen here in the emergency department couple times for this with negative CT scan and ultrasound.  She has been taking Carafate and Pepcid without relief.  Pain not slightly worse with food intake.  Has not attempted any antiemetics at home.  No fever, chills, chest pain, shortness of breath, dysuria, diarrhea, constipation, pelvic pain, vaginal discharge.  Was seen by PCP told if her pain worsened to come to the emergency department for evaluation.  Her PCP, Tammy Oconnor stated they had placed an emergent GI referral.  She has not been called to schedule appointment with them.  Rates her current pain Oconnor 9/10. Denies marijuana use.  History obtained from patient, family member past medical records.  No interpreter used.   [facility]  HPI     Past Medical History:  Diagnosis Date  . Bleeding disorder (HCC)     There are no problems to display for this patient.   History reviewed. No pertinent surgical history.   OB History   No obstetric history on file.     Family History  Problem Relation Age of Onset  . Migraines Mother   . Depression Mother   . Migraines Father     Social History   Tobacco Use  . Smoking status: Never Smoker  . Smokeless tobacco: Never Used  Vaping Use  . Vaping Use: Never used  Substance Use Topics  . Alcohol use: Never  . Drug use: Never    Home Medications Prior to Admission medications    Medication Sig Start Date End Date Taking? Authorizing Provider  acetaminophen (TYLENOL) 500 MG tablet Take 500 mg by mouth every 6 (six) hours as needed for moderate pain.   Yes [provider]  omeprazole (PRILOSEC) 20 MG capsule Take 1 capsule (20 mg total) by mouth daily. 06/17/20  Yes Tammy Horseman, PA-C  ondansetron (ZOFRAN) 8 MG tablet Take 8 mg by mouth every 8 (eight) hours as needed for nausea or vomiting. 06/22/20  Yes [provider]  docusate sodium (COLACE) 100 MG capsule Take 1 capsule (100 mg total) by mouth every 12 (twelve) hours. Patient not taking: Reported on 06/23/2020 04/05/20   Tammy Horseman, PA-C  HYDROcodone-acetaminophen (NORCO/VICODIN) 5-325 MG tablet Take 1 tablet by mouth every 4 (four) hours as needed. 06/23/20   Tammy Sterbenz A, PA-C  ondansetron (ZOFRAN ODT) 4 MG disintegrating tablet Take 1 tablet (4 mg total) by mouth every 8 (eight) hours as needed for nausea or vomiting. 06/23/20   Tammy Fojtik A, PA-C  sucralfate (CARAFATE) 1 g tablet Take 1 tablet (1 g total) by mouth 4 (four) times daily -  with meals and at bedtime. Patient not taking: Reported on 06/23/2020 06/17/20   Tammy Horseman, PA-C    Allergies    Dhea [nutritional supplements]  Review of Systems   Review of Systems  Constitutional: Negative.   HENT: Negative.   Respiratory:  Negative.   Cardiovascular: Negative.   Gastrointestinal: Positive for abdominal pain, nausea and vomiting. Negative for abdominal distention, anal bleeding, blood in stool, constipation, diarrhea and rectal pain.  Genitourinary: Negative.   Musculoskeletal: Negative.   Skin: Negative.   Neurological: Negative.   All other systems reviewed and are negative.   Physical Exam Updated Vital Signs BP (!) 110/59   Pulse 77   Temp 98.5 F (36.9 C) (Oral)   Resp 18   LMP 06/16/2020   SpO2 96%   Physical Exam Vitals and nursing note reviewed.  Constitutional:      General: She is  not in acute distress.    Appearance: She is well-developed and well-nourished. She is not ill-appearing, toxic-appearing or diaphoretic.  HENT:     Head: Normocephalic and atraumatic.     Mouth/Throat:     Mouth: Mucous membranes are moist.  Eyes:     Pupils: Pupils are equal, round, and reactive to light.  Cardiovascular:     Rate and Rhythm: Normal rate.     Pulses: Intact distal pulses.     Heart sounds: Normal heart sounds.  Pulmonary:     Effort: Pulmonary effort is normal. No respiratory distress.     Breath sounds: Normal breath sounds.  Abdominal:     General: Bowel sounds are normal. There is no distension.     Palpations: Abdomen is soft.     Tenderness: There is abdominal tenderness in the right upper quadrant and epigastric area. There is no right CVA tenderness, left CVA tenderness, guarding or rebound. Negative signs include Murphy's sign and McBurney's sign.     Hernia: No hernia is present.  Musculoskeletal:        General: Normal range of motion.     Cervical back: Normal range of motion.  Skin:    General: Skin is warm and dry.  Neurological:     Mental Status: She is alert.  Psychiatric:        Mood and Affect: Mood and affect normal.     ED Results / Procedures / Treatments   Labs (all labs ordered are listed, but only abnormal results are displayed) Labs Reviewed  COMPREHENSIVE METABOLIC PANEL - Abnormal; Notable for the following components:      Result Value   AST 14 (*)    All other components within normal limits  RAPID URINE DRUG SCREEN, HOSP PERFORMED - Abnormal; Notable for the following components:   Tetrahydrocannabinol POSITIVE (*)    All other components within normal limits  LIPASE, BLOOD  CBC  URINALYSIS, ROUTINE W REFLEX MICROSCOPIC  I-STAT BETA HCG BLOOD, ED ([facility], [facility], [facility] ONLY)   EKG None  Radiology CT ABDOMEN PELVIS W CONTRAST  Result Date: 06/23/2020 CLINICAL DATA:  Postprandial vomiting, abdominal pain EXAM: CT ABDOMEN AND  PELVIS WITH CONTRAST TECHNIQUE: Multidetector CT imaging of the abdomen and pelvis was performed using the standard protocol following bolus administration of intravenous contrast. CONTRAST:  OMNIPAQUE IOHEXOL 300 MG/ML  SOLN COMPARISON:  06/16/2020, 04/05/2020 FINDINGS: Lower chest: No acute pleural or parenchymal lung disease. Hepatobiliary: No focal liver abnormality is seen. No gallstones, gallbladder wall thickening, or biliary dilatation. Pancreas: Unremarkable. No pancreatic ductal dilatation or surrounding inflammatory changes. Spleen: Normal in size without focal abnormality. Adrenals/Urinary Tract: Adrenal glands are unremarkable. Kidneys are normal, without renal calculi, focal lesion, or hydronephrosis. Bladder is unremarkable. Stomach/Bowel: No bowel obstruction or ileus. Normal appendix right lower quadrant. No bowel wall thickening or inflammatory change. Vascular/Lymphatic: No  significant vascular findings are present. No enlarged abdominal or pelvic lymph nodes. Reproductive: Uterus and bilateral adnexa are unremarkable. Other: No free fluid or free gas.  No abdominal wall hernia. Musculoskeletal: No acute bony abnormality. Reconstructed images demonstrate no additional findings. IMPRESSION: 1. Stable exam, no acute intra-abdominal or intrapelvic process. Electronically Signed   By: Sharlet Salina M.D.   On: 06/23/2020 15:55    Procedures Procedures (including critical care time)  Medications Ordered in ED Medications  sodium chloride 0.9 % bolus 1,000 mL (0 mLs Intravenous Stopped 06/23/20 1707)  ondansetron (ZOFRAN) injection 4 mg (4 mg Intravenous Given 06/23/20 1509)  morphine 4 MG/ML injection 4 mg (4 mg Intravenous Given 06/23/20 1511)  iohexol (OMNIPAQUE) 300 MG/ML solution 100 mL (100 mLs Intravenous Contrast Given 06/23/20 1542)  ketorolac (TORADOL) 30 MG/ML injection 30 mg (30 mg Intravenous Given 06/23/20 1706)  HYDROmorphone (DILAUDID) injection 1 mg (1 mg Intravenous  Given 06/23/20 1706)    ED Course  I have reviewed the triage vital signs and the nursing notes.  Pertinent labs & imaging results that were available during my care of the patient were reviewed by me and considered in my medical decision making (see chart for details).  19 year old presents for evaluation of abdominal pain.  She is afebrile, nonseptic, non-ill-appearing.  Began Oconnor few months ago however worsening over the last few weeks.  Has been evaluated in the emergency department times in the past.  Recently last week with negative right upper quadrant ultrasound.  Seen again by PCP today.  Had urgent GI referral placed.  Patient without any melena, bright blood per rectum.  No diarrhea.  Denies any marijuana use.  No urinary complaints.  No chest pain or shortness of breath.  Does have diffuse tenderness to her upper abdomen however negative Murphy sign, negative McBurney point.  No chronic NSAID use, EtOH use history of PUD.  No upper respiratory complaints to suggest infectious process, PE, dissection as cause of her right upper quadrant abdominal pain.  Plan on labs, imaging and reassess.  Labs and imaging personally reviewed and interpreted:  CBC without leukocytosis Metabolic panel without electrolyte, renal normality Pregnancy test negative Lipase 38 UA negative for infection UDS positive for THC.  On reevaluation patient states pain has mildly improved however has not resolved.  Took additional pain medication.  I did discuss with patient in room her positive THC on her UDS.  States she uses delta 8.  Uses this weekly.  Will give additional medication.  CT scan is reassuring without any acute intra-abdominal process.  Had ultrasound negative last week without any stones.  Question if patient needs outpatient HIDA scan given recurrent pain.  Reevaluation pain control.  She is tolerating p.o. intake.  Will place referral to GI.  Discussed symptomatic management at home.  She is  agreeable to this.  Patient is nontoxic, nonseptic appearing, in no apparent distress.  Patient's pain and other symptoms adequately managed in emergency department.  Fluid bolus given.  Labs, imaging and vitals reviewed.  Patient does not meet the SIRS or Sepsis criteria.  On repeat exam patient does not have Oconnor surgical abdomin and there are no peritoneal signs.  No indication of appendicitis, bowel obstruction, bowel perforation, cholecystitis, diverticulitis, PID or ectopic pregnancy.    The patient has been appropriately medically screened and/or stabilized in the ED. I have low suspicion for any other emergent medical condition which would require further screening, evaluation or treatment in the ED or require  inpatient management.  Patient is hemodynamically stable and in no acute distress.  Patient able to ambulate in department prior to ED.  Evaluation does not show acute pathology that would require ongoing or additional emergent interventions while in the emergency department or further inpatient treatment.  I have discussed the diagnosis with the patient and answered all questions.  Pain is been managed while in the emergency department and patient has no further complaints prior to discharge.  Patient is comfortable with plan discussed in room and is stable for discharge at this time.  I have discussed strict return precautions for returning to the emergency department.  Patient was encouraged to follow-up with PCP/specialist refer to at discharge.     MDM Rules/Calculators/Oconnor&P                           Final Clinical Impression(s) / ED Diagnoses Final diagnoses:  Right upper quadrant abdominal pain    Rx / DC Orders ED Discharge Orders         Ordered    HYDROcodone-acetaminophen (NORCO/VICODIN) 5-325 MG tablet  Every 4 hours PRN        06/23/20 1810    ondansetron (ZOFRAN ODT) 4 MG disintegrating tablet  Every 8 hours PRN        06/23/20 1811           Odie Edmonds A,  PA-C 06/23/20 1811    Charlynne PanderYao, David Hsienta, MD 06/23/20 2223

## 2020-06-23 NOTE — ED Triage Notes (Signed)
Patient reports she was sent by her PCP to have a CT scan done. Patient reports she was seen here about a week ago and had [place] and X-ray but is still having frequent back pressure, abdominal pain, and emesis.

## 2020-06-23 NOTE — Discharge Instructions (Addendum)
Call to schedule appointment with [facility].  Their contact information is listed in your discharge paperwork.  Return for new worsening symptoms.  I have written you for a short course of medication to help with your pain.

## 2020-07-19 ENCOUNTER — Other Ambulatory Visit: Payer: Self-pay | Admitting: Physician Assistant

## 2020-07-19 DIAGNOSIS — R1011 Right upper quadrant pain: Secondary | ICD-10-CM

## 2020-08-16 ENCOUNTER — Encounter (HOSPITAL_COMMUNITY)
Admission: RE | Admit: 2020-08-16 | Payer: No Typology Code available for payment source | Source: Ambulatory Visit | Admitting: Emergency Medicine

## 2020-08-16 ENCOUNTER — Other Ambulatory Visit: Payer: Self-pay

## 2020-08-16 ENCOUNTER — Encounter (HOSPITAL_COMMUNITY): Payer: Self-pay | Admitting: Emergency Medicine

## 2020-08-16 ENCOUNTER — Emergency Department (HOSPITAL_COMMUNITY)
Admission: EM | Admit: 2020-08-16 | Discharge: 2020-08-16 | Disposition: A | Discharge status: Home / Self Care | Payer: Medicaid Other | Attending: Emergency Medicine | Admitting: Emergency Medicine

## 2020-08-16 ENCOUNTER — Ambulatory Visit (HOSPITAL_COMMUNITY)
Admission: EM | Admit: 2020-08-16 | Discharge: 2020-08-16 | Disposition: A | Discharge status: Home / Self Care | Payer: No Typology Code available for payment source | Attending: Emergency Medicine | Admitting: Emergency Medicine

## 2020-08-16 ENCOUNTER — Emergency Department (HOSPITAL_COMMUNITY): Payer: Medicaid Other

## 2020-08-16 ENCOUNTER — Encounter (HOSPITAL_COMMUNITY): Payer: Self-pay

## 2020-08-16 ENCOUNTER — Other Ambulatory Visit (HOSPITAL_COMMUNITY): Payer: Self-pay | Admitting: Emergency Medicine

## 2020-08-16 DIAGNOSIS — S30811A Abrasion of abdominal wall, initial encounter: Secondary | ICD-10-CM | POA: Diagnosis not present

## 2020-08-16 DIAGNOSIS — T7421XA Adult sexual abuse, confirmed, initial encounter: Secondary | ICD-10-CM | POA: Insufficient documentation

## 2020-08-16 DIAGNOSIS — R0602 Shortness of breath: Secondary | ICD-10-CM | POA: Insufficient documentation

## 2020-08-16 DIAGNOSIS — S1091XA Abrasion of unspecified part of neck, initial encounter: Secondary | ICD-10-CM | POA: Insufficient documentation

## 2020-08-16 DIAGNOSIS — S3991XA Unspecified injury of abdomen, initial encounter: Secondary | ICD-10-CM | POA: Diagnosis present

## 2020-08-16 DIAGNOSIS — R1011 Right upper quadrant pain: Secondary | ICD-10-CM | POA: Insufficient documentation

## 2020-08-16 DIAGNOSIS — R000 Tachycardia, unspecified: Secondary | ICD-10-CM | POA: Insufficient documentation

## 2020-08-16 DIAGNOSIS — T07XXXA Unspecified multiple injuries, initial encounter: Secondary | ICD-10-CM

## 2020-08-16 DIAGNOSIS — S20319A Abrasion of unspecified front wall of thorax, initial encounter: Secondary | ICD-10-CM | POA: Insufficient documentation

## 2020-08-16 DIAGNOSIS — F410 Panic disorder [episodic paroxysmal anxiety] without agoraphobia: Secondary | ICD-10-CM | POA: Insufficient documentation

## 2020-08-16 DIAGNOSIS — Y92039 Unspecified place in apartment as the place of occurrence of the external cause: Secondary | ICD-10-CM | POA: Diagnosis not present

## 2020-08-16 DIAGNOSIS — Z23 Encounter for immunization: Secondary | ICD-10-CM | POA: Diagnosis not present

## 2020-08-16 HISTORY — DX: Von Willebrand disease, unspecified: D68.00

## 2020-08-16 HISTORY — DX: Von Willebrand's disease: D68.0

## 2020-08-16 LAB — COMPREHENSIVE METABOLIC PANEL
ALT: 15 U/L (ref 0–44)
AST: 15 U/L (ref 15–41)
Albumin: 4.5 g/dL (ref 3.5–5.0)
Alkaline Phosphatase: 56 U/L (ref 38–126)
Anion gap: 10 (ref 5–15)
BUN: 11 mg/dL (ref 6–20)
CO2: 22 mmol/L (ref 22–32)
Calcium: 9.1 mg/dL (ref 8.9–10.3)
Chloride: 107 mmol/L (ref 98–111)
Creatinine, Ser: 0.49 mg/dL (ref 0.44–1.00)
GFR, Estimated: >60 mL/min (ref >60)
Glucose, Bld: 112 mg/dL — ABNORMAL HIGH (ref 70–99)
Potassium: 3.5 mmol/L (ref 3.5–5.1)
Sodium: 139 mmol/L (ref 135–145)
Total Bilirubin: 0.7 mg/dL (ref 0.3–1.2)
Total Protein: 7.8 g/dL (ref 6.5–8.1)

## 2020-08-16 LAB — HEPATITIS C ANTIBODY: HCV Ab: NONREACTIVE

## 2020-08-16 LAB — CBC
HCT: 36.4 % (ref 36.0–46.0)
Hemoglobin: 12.4 g/dL (ref 12.0–15.0)
MCH: 30.4 pg (ref 26.0–34.0)
MCHC: 34.1 g/dL (ref 30.0–36.0)
MCV: 89.2 fL (ref 80.0–100.0)
Platelets: 299 10*3/uL (ref 150–400)
RBC: 4.08 MIL/uL (ref 3.87–5.11)
RDW: 12.8 % (ref 11.5–15.5)
WBC: 12.9 10*3/uL — ABNORMAL HIGH (ref 4.0–10.5)
nRBC: 0 % (ref 0.0–0.2)

## 2020-08-16 LAB — POC URINE PREG, ED: Preg Test, Ur: NEGATIVE

## 2020-08-16 LAB — HEPATITIS B SURFACE ANTIGEN: Hepatitis B Surface Ag: NONREACTIVE

## 2020-08-16 LAB — RAPID HIV SCREEN (HIV 1/2 AB+AG)
HIV 1/2 Antibodies: NONREACTIVE
HIV-1 P24 Antigen - HIV24: NONREACTIVE

## 2020-08-16 LAB — RPR: RPR Ser Ql: NONREACTIVE

## 2020-08-16 MED ORDER — LORAZEPAM 0.5 MG PO TABS
0.5000 mg | ORAL_TABLET | Freq: Once | ORAL | Status: AC
  Administered 2020-08-16: 0.5 mg via ORAL
  Filled 2020-08-16: qty 1

## 2020-08-16 MED ORDER — TETANUS-DIPHTH-ACELL PERTUSSIS 5-2.5-18.5 LF-MCG/0.5 IM SUSY
0.5000 mL | PREFILLED_SYRINGE | Freq: Once | INTRAMUSCULAR | Status: AC
  Administered 2020-08-16: 0.5 mL via INTRAMUSCULAR
  Filled 2020-08-16: qty 0.5

## 2020-08-16 MED ORDER — ELVITEG-COBIC-EMTRICIT-TENOFAF 150-150-200-10 MG PO TABS: 1.0000 | ORAL_TABLET | Freq: Every day | ORAL | 0 refills | Status: DC

## 2020-08-16 MED ORDER — LIDOCAINE HCL (PF) 1 % IJ SOLN
1.0000 mL | Freq: Once | INTRAMUSCULAR | Status: AC
  Administered 2020-08-16: 1 mL
  Filled 2020-08-16: qty 30

## 2020-08-16 MED ORDER — ULIPRISTAL ACETATE 30 MG PO TABS
30.0000 mg | ORAL_TABLET | Freq: Once | ORAL | Status: AC
  Administered 2020-08-16: 30 mg via ORAL
  Filled 2020-08-16: qty 1

## 2020-08-16 MED ORDER — ACETAMINOPHEN 500 MG PO TABS
1000.0000 mg | ORAL_TABLET | Freq: Once | ORAL | Status: AC
  Administered 2020-08-16: 1000 mg via ORAL
  Filled 2020-08-16: qty 2

## 2020-08-16 MED ORDER — AZITHROMYCIN 250 MG PO TABS
1000.0000 mg | ORAL_TABLET | Freq: Once | ORAL | Status: AC
  Administered 2020-08-16: 1000 mg via ORAL
  Filled 2020-08-16: qty 4

## 2020-08-16 MED ORDER — METRONIDAZOLE 500 MG PO TABS
2000.0000 mg | ORAL_TABLET | Freq: Once | ORAL | Status: AC
  Administered 2020-08-16: 2000 mg via ORAL
  Filled 2020-08-16: qty 4

## 2020-08-16 MED ORDER — ELVITEG-COBIC-EMTRICIT-TENOFAF 150-150-200-10 MG PREPACK
1.0000 | ORAL_TABLET | Freq: Once | ORAL | Status: AC
  Administered 2020-08-16: 1 via ORAL
  Filled 2020-08-16: qty 1

## 2020-08-16 MED ORDER — CEFTRIAXONE SODIUM 1 G IJ SOLR
500.0000 mg | Freq: Once | INTRAMUSCULAR | Status: AC
  Administered 2020-08-16: 500 mg via INTRAMUSCULAR
  Filled 2020-08-16: qty 10

## 2020-08-16 MED FILL — GENVOYA TABLET: 150-150-200 | 30 days supply | Qty: 30 | Fill #0
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
## 2020-08-16 NOTE — SANE Note (Signed)
This patient received HIV nPEP medications.  Patient information entered into the [employer] portal, but no voucher numbers were provided.  Prescription and pertinent patient information were emailed to the [facility].

## 2020-08-16 NOTE — Discharge Instructions (Signed)
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 How should I use this medicine? Take this medicine by mouth with or without food. Your doctor may want you to use a quick-response pregnancy test prior to using the tablets. Take your medicine as soon as possible and not more than 5 days (120 hours) after the event. This medicine can be taken at any time during your menstrual cycle. Follow the dose instructions of your health care provider exactly. Contact your health care provider right away if you vomit within 3 hours of taking your medicine to discuss if you need to take another tablet. A patient package insert for the product will be given with each prescription and refill. Read this sheet carefully each time. The sheet may change frequently. Contact your  pediatrician regarding the use of this medicine in children. Special care may be needed. Overdosage: If you think you have taken too much of this medicine contact a poison control center or emergency room at once. NOTE: This medicine is only for you. Do not share this medicine with others. What if I miss a dose? This medicine is not for regular use. If you vomit within 3 hours of taking your dose, contact your health care professional for instructions. What may interact with this medicine? This medicine may interact with the following medications:  barbiturates such as phenobarbital or primidone  birth control pills  bosentan  carbamazepine  certain medicines for fungal infections like griseofulvin, itraconazole, and ketoconazole  certain medicines for HIV or AIDS or hepatitis  dabigatran  digoxin  felbamate  fexofenadine  oxcarbazepine  phenytoin  rifampin  St. John's Wort  topiramate This list may not describe all possible interactions. Give your health care provider a list of all the medicines, herbs, non-prescription drugs, or dietary supplements you use. Also tell them if you smoke, drink alcohol, or use illegal drugs. Some items may interact with your medicine. What should I watch for while using this medicine? Your period may begin a few days earlier or later than expected. If your period is more than 7 days late, pregnancy is possible. See your health care provider as soon as you can and get a pregnancy test. Talk to your healthcare provider before taking this medicine if you know or suspect that you are pregnant. Contact your healthcare provider if you think you may be pregnant and you have taken this medicine. If you have severe abdominal pain about 3 to 5 weeks after taking this medicine, you may have a pregnancy outside the womb, which is called an ectopic or tubal pregnancy. Call your health care provider or go to the nearest emergency room right away if you  think this is happening. Discuss birth control options with your health care provider. Emergency birth control is not to be used routinely to prevent pregnancy. It should not be used more than once in the same cycle. Birth control pills may not work properly while you are taking this medicine. Wait at least 5 days after taking this medicine to start or continue other hormone based birth control. Be sure to use a reliable barrier contraceptive method (such as a condom with spermicide)  between the time you take this medicine and your next period. This medicine does not protect you against HIV infection (AIDS) or any other sexually transmitted diseases (STDs). What side effects may I notice from receiving this medicine? Side effects that you should report to your doctor or health care professional as soon as possible:  allergic reactions like skin rash, itching or hives, swelling of the face, lips, or tongue Side effects that usually do not require medical attention (report to your doctor or health care professional if they continue or are bothersome):  abdominal pain or cramping  dizziness  headache  nausea  spotting  tiredness This list may not describe all possible side effects. Call your doctor for medical advice about side effects. You may report side effects to FDA at 1-800-FDA-1088. Where should I keep my medicine? Keep out of the reach of children. Store at between 20 and 25 degrees C (68 and 77 degrees F). Protect from light and keep in the blister card inside the original box until you are ready to take it. Throw away any unused medicine after the expiration date. NOTE: This sheet is a summary. It may not cover all possible information. If you have questions about this medicine, talk to your doctor, pharmacist, or health care provider.  2020 Elsevier/Gold Standard (2016-11-01 14:27:59)   Elvitegravir; Cobicistat; Emtricitabine; Tenofovir Alafenamide oral tablets   What is this  medicine? ELVITEGRAVIR; COBICISTAT; EMTRICITABINE; TENOFOVIR ALAFENAMIDE (el vye TEG ra veer; koe BIS i stat; em tri SIT uh bean; te NOE fo veer) is 3 antiretroviral medicines and a medication booster in 1 tablet. It is used to treat HIV. This medicine is not a cure for HIV. This medicine can lower, but not fully prevent, the risk of spreading HIV to others. This medicine may be used for other purposes; ask your health care provider or pharmacist if you have questions. COMMON BRAND NAME(S): Genvoya What should I tell my health care provider before I take this medicine? They need to know if you have any of these conditions:  kidney disease  liver disease  an unusual or allergic reaction to elvitegravir, cobicistat, emtricitabine, tenofovir, other medicines, foods, dyes, or preservatives  pregnant or trying to get pregnant  breast-feeding How should I use this medicine? Take this medicine by mouth with a glass of water. Follow the directions on the prescription label. Take this medicine with food. Take your medicine at regular intervals. Do not take your medicine more often than directed. For your anti-HIV therapy to work as well as possible, take each dose exactly as prescribed. Do not skip doses or stop your medicine even if you feel better. Skipping doses may make the HIV virus resistant to this medicine and other medicines. Do not stop taking except on your doctor's advice. Talk to your pediatrician regarding the use of this medicine in children. While this drug may be prescribed for selected conditions, precautions do apply. Overdosage: If you think you have taken too much of this medicine contact a poison control center or emergency room at once. NOTE: This medicine is only for you. Do not share this medicine with others. What if I miss a dose? If you miss a dose, take it as soon as you can. If it is almost time for your next dose, take only that dose. Do not take double or extra  doses. What may interact with this medicine? Do not take this medicine with any of the following medications:  adefovir  alfuzosin  certain medicines for seizures like carbamazepine, phenobarbital, phenytoin  cisapride  lumacaftor; ivacaftor  lurasidone  medicines for cholesterol like lovastatin, simvastatin  medicines for headaches like dihydroergotamine, ergotamine, methylergonovine  midazolam  naloxegol  other antiviral medicines for HIV or AIDS  pimozide  rifampin  sildenafil  St. John's wort  triazolam This medicine may also interact with the following medications:  antacids  atorvastatin  bosentan  buprenorphine; naloxone  certain antibiotics like clarithromycin, telithromycin, rifabutin, rifapentine  certain medications for anxiety or sleep like buspirone, clorazepate, diazepam, estazolam, flurazepam, zolpidem  certain medicines for blood pressure or heart disease like amlodipine, diltiazem, felodipine, metoprolol, nicardipine, nifedipine, timolol, verapamil  certain medicines for depression, anxiety, or psychiatric disturbances  certain medicines for erectile dysfunction like avanafil, sildenafil, tadalafil, vardenafil  certain medicines for fungal infection like itraconazole, ketoconazole, voriconazole  certain medicines that treat or prevent blood clots like warfarin, apixaban, betrixaban, dabigatran, edoxaban, and rivaroxaban  colchicine  cyclosporine  female hormones, like estrogens and progestins and birth control pills  medicines for infection like acyclovir, cidofovir, valacyclovir, ganciclovir, valganciclovir  medicines for irregular heart beat like amiodarone, bepridil, digoxin, disopyramide, dofetilide, flecainide, lidocaine, mexiletine, propafenone, quinidine  metformin  oxcarbazepine  phenothiazines like perphenazine, risperidone, thioridazine  salmeterol  sirolimus  steroid medicines like betamethasone, budesonide,  ciclesonide, dexamethasone, fluticasone, methylprednisolone, mometasone, triamcinolone  tacrolimus This list may not describe all possible interactions. Give your health care provider a list of all the medicines, herbs, non-prescription drugs, or dietary supplements you use. Also tell them if you smoke, drink alcohol, or use illegal drugs. Some items may interact with your medicine. What should I watch for while using this medicine? Visit your doctor or health care professional for regular check ups. Discuss any new symptoms with your doctor. You will need to have important blood work done while on this medicine. HIV is spread to others through sexual or blood contact. Talk to your doctor about how to stop the spread of HIV. If you have hepatitis B, talk to your doctor if you plan to stop this medicine. The symptoms of hepatitis B may get worse if you stop this medicine. Birth control pills may not work properly while you are taking this medicine. Talk to your doctor about using an extra method of birth control. Women who can still have children must use a reliable form of barrier contraception, like a condom. What side effects may I notice from receiving this medicine? Side effects that you should report to your doctor or health care professional as soon as possible:  allergic reactions like skin rash, itching or hives, swelling of the face, lips, or tongue  breathing problems  fast, irregular heartbeat  muscle pain or weakness  signs and symptoms of kidney injury like trouble passing urine or change in the amount of urine  signs and symptoms of liver injury like dark yellow or brown urine; general ill feeling or flu-like symptoms; light-colored stools; loss of appetite; right upper belly pain; unusually weak or tired; yellowing of the eyes or skin Side effects that usually do not require medical attention (report to your doctor or health care professional if they continue or are  bothersome):  diarrhea  headache  nausea  tiredness This list may not describe all possible side effects. Call your doctor for medical advice about side effects. You may report side effects to FDA at 1-800-FDA-1088. Where should I keep my medicine? Keep out of the reach of children. Store at room temperature below 30 degrees C (  86 degrees F). Throw away any unused medicine after the expiration date. NOTE: This sheet is a summary. It may not cover all possible information. If you have questions about this medicine, talk to your doctor, pharmacist, or health care provider.  2020 Elsevier/Gold Standard (2017-10-27 12:15:37)
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
## 2020-08-24 NOTE — SANE Note (Signed)
Received voicemaill from someone stating they were with "an assistance program" re this patient.  They did not give specifics or leave information other than patients name.  Was told to call [phone] with reference number [phone].  Returned call and spoke with a gentleman named "Jed" - gave this reference number and he asked for me to give information regarding patient to verify.  I stated that I normally have never gotten a call regarding this process as the provider and was not comfortable releasing information, but could verify.  Jed felt that I needed to provide patient information first as he did not think he could release information to me either.  Advised I would consult with my team who is more intimately involved in the day to day operational workflow and contact back.

## 2020-09-01 NOTE — SANE Note (Signed)
On 09/01/2020 at 0952, I returned a call to this patient related to a phone message left on 08/31/2020. No answer. I left a generic voicemail for the caller to call back.
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
## 2020-09-01 NOTE — SANE Note (Signed)
09/01/2020 1607, patient called twice. Once returning the Patient Request for Access form and the second call about when she could expect her record. I advised that I would check in with my supervisor again.

## 2020-09-20 ENCOUNTER — Other Ambulatory Visit (HOSPITAL_BASED_OUTPATIENT_CLINIC_OR_DEPARTMENT_OTHER): Payer: Self-pay

## 2020-09-29 ENCOUNTER — Ambulatory Visit: Payer: Medicaid Other | Admitting: Internal Medicine

## 2020-10-10 ENCOUNTER — Emergency Department (HOSPITAL_COMMUNITY): Payer: Medicaid Other

## 2020-10-10 ENCOUNTER — Other Ambulatory Visit: Payer: Self-pay

## 2020-10-10 ENCOUNTER — Emergency Department (HOSPITAL_COMMUNITY)
Admission: EM | Admit: 2020-10-10 | Discharge: 2020-10-10 | Disposition: A | Discharge status: Home / Self Care | Payer: Medicaid Other | Attending: Emergency Medicine | Admitting: Emergency Medicine

## 2020-10-10 ENCOUNTER — Encounter (HOSPITAL_COMMUNITY): Payer: Self-pay | Admitting: Emergency Medicine

## 2020-10-10 DIAGNOSIS — R112 Nausea with vomiting, unspecified: Secondary | ICD-10-CM | POA: Diagnosis not present

## 2020-10-10 DIAGNOSIS — R1031 Right lower quadrant pain: Secondary | ICD-10-CM | POA: Diagnosis not present

## 2020-10-10 DIAGNOSIS — R42 Dizziness and giddiness: Secondary | ICD-10-CM | POA: Insufficient documentation

## 2020-10-10 LAB — CBC
HCT: 40 % (ref 36.0–46.0)
Hemoglobin: 13.7 g/dL (ref 12.0–15.0)
MCH: 29.9 pg (ref 26.0–34.0)
MCHC: 34.3 g/dL (ref 30.0–36.0)
MCV: 87.3 fL (ref 80.0–100.0)
Platelets: 350 10*3/uL (ref 150–400)
RBC: 4.58 MIL/uL (ref 3.87–5.11)
RDW: 12.5 % (ref 11.5–15.5)
WBC: 9.3 10*3/uL (ref 4.0–10.5)
nRBC: 0 % (ref 0.0–0.2)

## 2020-10-10 LAB — URINALYSIS, ROUTINE W REFLEX MICROSCOPIC
Bilirubin Urine: NEGATIVE
Glucose, UA: NEGATIVE mg/dL
Hgb urine dipstick: NEGATIVE
Ketones, ur: 80 mg/dL — AB
Leukocytes,Ua: NEGATIVE
Nitrite: NEGATIVE
Protein, ur: NEGATIVE mg/dL
Specific Gravity, Urine: 1.028 (ref 1.005–1.030)
pH: 5 (ref 5.0–8.0)

## 2020-10-10 LAB — BASIC METABOLIC PANEL
Anion gap: 10 (ref 5–15)
BUN: 14 mg/dL (ref 6–20)
CO2: 24 mmol/L (ref 22–32)
Calcium: 9.4 mg/dL (ref 8.9–10.3)
Chloride: 103 mmol/L (ref 98–111)
Creatinine, Ser: 0.73 mg/dL (ref 0.44–1.00)
GFR, Estimated: >60 mL/min (ref >60)
Glucose, Bld: 91 mg/dL (ref 70–99)
Potassium: 3.7 mmol/L (ref 3.5–5.1)
Sodium: 137 mmol/L (ref 135–145)

## 2020-10-10 LAB — HCG, QUANTITATIVE, PREGNANCY: hCG, Beta Chain, Quant, S: <1 m[IU]/mL (ref <5)

## 2020-10-10 MED ORDER — ONDANSETRON 4 MG PO TBDP
4.0000 mg | ORAL_TABLET | Freq: Once | ORAL | Status: AC
  Administered 2020-10-10: 4 mg via ORAL
  Filled 2020-10-10: qty 1

## 2020-10-10 MED ORDER — ONDANSETRON 4 MG PO TBDP: 4.0000 mg | ORAL_TABLET | Freq: Three times a day (TID) | ORAL | 0 refills | Status: AC | PRN

## 2020-10-10 MED ORDER — ACETAMINOPHEN 500 MG PO TABS
1000.0000 mg | ORAL_TABLET | Freq: Once | ORAL | Status: AC
  Administered 2020-10-10: 1000 mg via ORAL
  Filled 2020-10-10: qty 2

## 2020-10-10 NOTE — ED Triage Notes (Signed)
Patient is complaining of right flank pain that is wrapping around to back and abdomen. Patient states it started on Sunday. Patient has also been throwing up.

## 2020-10-10 NOTE — ED Notes (Signed)
Ginger ale and saltine crackers given to pt for po challenge, will reassess.

## 2020-10-10 NOTE — ED Notes (Signed)
Pt passed po challenge
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
## 2020-10-10 NOTE — Discharge Instructions (Signed)
Recommend Miralax up to 3 times daily until bowels clear. Maximum of 3 consecutive days.   Take Zofran for any nausea. Follow up with your doctor for recheck in 2 days to insure pain is resolving and return to the ED with any severe pain, uncontrolled vomiting, bloody stools or new concern.
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
## 2024-02-19 ENCOUNTER — Encounter: Admitting: Obstetrics and Gynecology

## 2024-06-08 ENCOUNTER — Encounter: Admitting: Advanced Practice Midwife
